# Patient Record
Sex: Female | Born: 1985 | Race: Asian | Hispanic: No | Marital: Single | Smoking: Never smoker
Health system: Southern US, Community
[De-identification: ages and names within clinical notes are randomized; demographics above are authoritative.]

---

## 2006-04-12 ENCOUNTER — Emergency Department (HOSPITAL_COMMUNITY): Admission: EM | Admit: 2006-04-12 | Discharge: 2006-04-12 | Payer: Self-pay | Admitting: Emergency Medicine
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
## 2013-09-17 ENCOUNTER — Ambulatory Visit (INDEPENDENT_AMBULATORY_CARE_PROVIDER_SITE_OTHER): Payer: BC Managed Care – PPO | Admitting: Family Medicine

## 2013-09-17 VITALS — BP 98/62 | HR 83 | Temp 98.1°F | Resp 16 | Ht 60.5 in | Wt 119.0 lb

## 2013-09-17 DIAGNOSIS — Z309 Encounter for contraceptive management, unspecified: Secondary | ICD-10-CM

## 2013-09-17 MED ORDER — NORETHINDRONE ACET-ETHINYL EST 1-20 MG-MCG PO TABS
1.0000 | ORAL_TABLET | Freq: Every day | ORAL | Status: AC
Start: 2013-09-17 — End: ?

## 2013-09-17 NOTE — Progress Notes (Signed)
   Subjective:    Patient ID: Diana Mendoza, female    DOB: 1985/10/05, 28 y.o.   MRN: [id number]  HPI Patient presents today to discuss contraception options. Patient has found that she is more moody on her Sprintec oral contraception pills. She started them about a year ago and has noticed that she has been moody at different times during the month. She has not felt anxious or depressed, just more labile in her moods. She has been otherwise happy with the oral contraceptives as birth control. She finished her current pack of Sprintec yesterday and would like to start a new pack today. She had her last pap smear 4/14 and reports that it was normal. She is married with no children, but would like to have children in the future.   PMH- none PSH- none SH- Non smoker, occasional alcohol use, no illicit drugs FH- married, no children   Review of Systems Patient denies chest pain, cough, dysuria, urinary frequency, diarrhea, constipation.    Objective:   Physical Exam  Vitals reviewed. Constitutional: She is oriented to person, place, and time. She appears well-developed and well-nourished. No distress.  HENT:  Head: Normocephalic and atraumatic.  Right Ear: External ear normal.  Left Ear: External ear normal.  Eyes: Conjunctivae are normal. Right eye exhibits no discharge. Left eye exhibits no discharge. No scleral icterus.  Neck: Normal range of motion.  Cardiovascular: Normal rate, regular rhythm and normal heart sounds.   Pulmonary/Chest: Effort normal and breath sounds normal.  Genitourinary:  Patient did not wish to have gyn exam during this visit  Musculoskeletal: Normal range of motion.  Neurological: She is alert and oriented to person, place, and time.  Skin: Skin is warm and dry. She is not diaphoretic.  Psychiatric: She has a normal mood and affect. Her behavior is normal. Judgment and thought content normal.      Assessment & Plan:  1. Contraception management -  norethindrone-ethinyl estradiol (MICROGESTIN,JUNEL,LOESTRIN) 1-20 MG-MCG tablet; Take 1 tablet by mouth daily.  Dispense: 1 Package; Refill: 11 -Discussed trying a lower dose OCP to try to decrease side effects. Patient to follow up if no improvement in 3 months. -Encouraged patient to schedule a CPE at the appointment center.  Emi Belfasteborah B. Gessner, FNP-BC  [facility]  09/17/2013 5:57 PM
